# Patient Record
Sex: Male | Born: 1995 | Race: White | Hispanic: No | Marital: Single | Smoking: Current every day smoker
Health system: Southern US, Community
[De-identification: ages and names within clinical notes are randomized; demographics above are authoritative.]

## PROBLEM LIST (undated history)

## (undated) DIAGNOSIS — F32A Depression, unspecified: Secondary | ICD-10-CM

## (undated) DIAGNOSIS — F913 Oppositional defiant disorder: Secondary | ICD-10-CM

## (undated) DIAGNOSIS — F1210 Cannabis abuse, uncomplicated: Secondary | ICD-10-CM

## (undated) DIAGNOSIS — F909 Attention-deficit hyperactivity disorder, unspecified type: Secondary | ICD-10-CM

## (undated) DIAGNOSIS — F329 Major depressive disorder, single episode, unspecified: Secondary | ICD-10-CM

## (undated) HISTORY — DX: Attention-deficit hyperactivity disorder, unspecified type: F90.9

## (undated) HISTORY — DX: Depression, unspecified: F32.A

## (undated) HISTORY — PX: OTHER SURGICAL HISTORY: SHX169

## (undated) HISTORY — DX: Major depressive disorder, single episode, unspecified: F32.9

---
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
## 2016-04-27 ENCOUNTER — Ambulatory Visit: Payer: Self-pay | Admitting: Nurse Practitioner

## 2019-02-04 ENCOUNTER — Encounter: Payer: Self-pay | Admitting: Family Medicine

## 2019-02-05 ENCOUNTER — Encounter: Payer: Self-pay | Admitting: Family Medicine

## 2019-06-11 ENCOUNTER — Telehealth: Payer: Self-pay | Admitting: Orthopaedic Surgery

## 2019-06-11 NOTE — Telephone Encounter (Signed)
Made in error

## 2020-07-07 ENCOUNTER — Emergency Department (HOSPITAL_COMMUNITY): Payer: Managed Care, Other (non HMO)

## 2020-07-07 ENCOUNTER — Other Ambulatory Visit: Payer: Self-pay

## 2020-07-07 ENCOUNTER — Encounter (HOSPITAL_COMMUNITY): Payer: Self-pay | Admitting: *Deleted

## 2020-07-07 ENCOUNTER — Emergency Department (HOSPITAL_COMMUNITY)
Admission: EM | Admit: 2020-07-07 | Discharge: 2020-07-07 | Disposition: A | Discharge status: Home / Self Care | Payer: Managed Care, Other (non HMO) | Attending: Emergency Medicine | Admitting: Emergency Medicine

## 2020-07-07 DIAGNOSIS — F17200 Nicotine dependence, unspecified, uncomplicated: Secondary | ICD-10-CM | POA: Insufficient documentation

## 2020-07-07 DIAGNOSIS — R0602 Shortness of breath: Secondary | ICD-10-CM | POA: Diagnosis not present

## 2020-07-07 DIAGNOSIS — R079 Chest pain, unspecified: Secondary | ICD-10-CM | POA: Insufficient documentation

## 2020-07-07 DIAGNOSIS — E876 Hypokalemia: Secondary | ICD-10-CM | POA: Diagnosis not present

## 2020-07-07 DIAGNOSIS — R42 Dizziness and giddiness: Secondary | ICD-10-CM | POA: Insufficient documentation

## 2020-07-07 DIAGNOSIS — E860 Dehydration: Secondary | ICD-10-CM

## 2020-07-07 LAB — URINALYSIS, ROUTINE W REFLEX MICROSCOPIC
Bilirubin Urine: NEGATIVE
Glucose, UA: NEGATIVE mg/dL
Hgb urine dipstick: NEGATIVE
Ketones, ur: NEGATIVE mg/dL
Leukocytes,Ua: NEGATIVE
Nitrite: NEGATIVE
Protein, ur: NEGATIVE mg/dL
Specific Gravity, Urine: 1.004 — ABNORMAL LOW (ref 1.005–1.030)
pH: 6 (ref 5.0–8.0)

## 2020-07-07 LAB — CBC WITH DIFFERENTIAL/PLATELET
Abs Immature Granulocytes: 0.01 10*3/uL (ref 0.00–0.07)
Basophils Absolute: 0 10*3/uL (ref 0.0–0.1)
Basophils Relative: 1 %
Eosinophils Absolute: 0.1 10*3/uL (ref 0.0–0.5)
Eosinophils Relative: 2 %
HCT: 47.2 % (ref 39.0–52.0)
Hemoglobin: 16.1 g/dL (ref 13.0–17.0)
Immature Granulocytes: 0 %
Lymphocytes Relative: 36 %
Lymphs Abs: 1.7 10*3/uL (ref 0.7–4.0)
MCH: 32.7 pg (ref 26.0–34.0)
MCHC: 34.1 g/dL (ref 30.0–36.0)
MCV: 95.7 fL (ref 80.0–100.0)
Monocytes Absolute: 0.7 10*3/uL (ref 0.1–1.0)
Monocytes Relative: 14 %
Neutro Abs: 2.2 10*3/uL (ref 1.7–7.7)
Neutrophils Relative %: 47 %
Platelets: 259 10*3/uL (ref 150–400)
RBC: 4.93 MIL/uL (ref 4.22–5.81)
RDW: 12.8 % (ref 11.5–15.5)
WBC: 4.7 10*3/uL (ref 4.0–10.5)
nRBC: 0 % (ref 0.0–0.2)

## 2020-07-07 LAB — BASIC METABOLIC PANEL
Anion gap: 11 (ref 5–15)
BUN: 12 mg/dL (ref 6–20)
CO2: 22 mmol/L (ref 22–32)
Calcium: 9.8 mg/dL (ref 8.9–10.3)
Chloride: 103 mmol/L (ref 98–111)
Creatinine, Ser: 1.24 mg/dL (ref 0.61–1.24)
GFR, Estimated: >60 mL/min (ref >60)
Glucose, Bld: 66 mg/dL — ABNORMAL LOW (ref 70–99)
Potassium: 3.2 mmol/L — ABNORMAL LOW (ref 3.5–5.1)
Sodium: 136 mmol/L (ref 135–145)

## 2020-07-07 LAB — RAPID URINE DRUG SCREEN, HOSP PERFORMED
Amphetamines: NOT DETECTED
Barbiturates: NOT DETECTED
Benzodiazepines: NOT DETECTED
Cocaine: POSITIVE — AB
Opiates: NOT DETECTED
Tetrahydrocannabinol: POSITIVE — AB

## 2020-07-07 LAB — TROPONIN I (HIGH SENSITIVITY)
Troponin I (High Sensitivity): 5 ng/L (ref <18)
Troponin I (High Sensitivity): 4 ng/L (ref <18)

## 2020-07-07 MED ORDER — LORAZEPAM 1 MG PO TABS
1.0000 mg | ORAL_TABLET | Freq: Once | ORAL | Status: AC
  Administered 2020-07-07: 1 mg via ORAL
  Filled 2020-07-07: qty 1

## 2020-07-07 MED ORDER — POTASSIUM CHLORIDE 20 MEQ PO PACK
40.0000 meq | PACK | Freq: Once | ORAL | Status: AC
  Administered 2020-07-07: 40 meq via ORAL
  Filled 2020-07-07: qty 2

## 2020-07-07 MED ORDER — SODIUM CHLORIDE 0.9 % IV BOLUS
1000.0000 mL | Freq: Once | INTRAVENOUS | Status: AC
  Administered 2020-07-07: 1000 mL via INTRAVENOUS

## 2020-07-07 MED ORDER — POTASSIUM CHLORIDE CRYS ER 20 MEQ PO TBCR
40.0000 meq | EXTENDED_RELEASE_TABLET | Freq: Once | ORAL | Status: AC
Start: 2020-07-07 — End: 2020-07-07
  Administered 2020-07-07: 40 meq via ORAL
  Filled 2020-07-07: qty 2

## 2020-07-07 NOTE — ED Provider Notes (Signed)
[facility] EMERGENCY DEPARTMENT Provider Note   CSN: [id number] Arrival date & time: 07/07/20  1230     History Chief Complaint  Patient presents with   Chest Pain    Richard Cooke is a 25 y.o. male with PMHx ADHD, Depression, marijuana abuse, and polysubstance abuse who presents to the ED today with complaint of sudden onset, constant, shortness of breath that began about 45 minutes PTA. Pt also complains of diffuse chest tightness. He states he was at work when the symptoms started. He states there is some air conditioning but not much. He was wearing a T shirt, sweat pants, and boots. He feels lightheaded and nauseated as well. Pt states he has never had symptoms like this in the past. He denies nausea or vomiting. Pt is a current everyday smoker. He denies Fhx of CAD.   The history is provided by the patient and medical records.      Past Medical History:  Diagnosis Date   ADHD (attention deficit hyperactivity disorder)    Depression    Marijuana abuse    ODD (oppositional defiant disorder)     Patient Active Problem List   Diagnosis Date Noted   Polysubstance abuse (HCC) 12/14/2014   Generalized anxiety disorder 11/23/2013   Insomnia due to stress 11/23/2013   History of ADHD 11/23/2013   ADHD (attention deficit hyperactivity disorder), combined type 11/23/2013   Excessive anger 03/19/2012    Past Surgical History:  Procedure Laterality Date   Tubes in ears         Family History  Problem Relation Age of Onset   Depression Paternal Aunt    Anxiety disorder Paternal Aunt    Depression Paternal Grandfather    ADD / ADHD Sister    Drug abuse Maternal Aunt    Bipolar disorder Maternal Aunt    Alcohol abuse Maternal Grandfather    Dementia Maternal Grandfather    Dementia Maternal Grandmother        MGGM   Seizures Cousin    OCD Neg Hx    Paranoid behavior Neg Hx    Schizophrenia Neg Hx    Sexual abuse Neg Hx    Physical abuse Neg Hx     Social  History   Tobacco Use   Smoking status: Every Day    Pack years: 0.00   Smokeless tobacco: Former    Types: Snuff  Substance Use Topics   Alcohol use: Yes   Drug use: Yes    Home Medications Prior to Admission medications   Medication Sig Start Date End Date Taking? Authorizing Provider  albuterol (PROVENTIL HFA;VENTOLIN HFA) 108 (90 BASE) MCG/ACT inhaler Inhale 2 puffs into the lungs every 6 (six) hours as needed for wheezing. 12/17/14  Yes Armandina Stammer I, NP  hydrOXYzine (ATARAX/VISTARIL) 25 MG tablet Take 1 tablet (25 mg total) by mouth every 6 (six) hours as needed for anxiety or itching. Patient not taking: No sig reported 12/17/14   Sanjuana Kava, NP    Allergies    Daytrana [methylphenidate]  Review of Systems   Review of Systems  Constitutional:  Negative for chills and fever.  Respiratory:  Positive for shortness of breath. Negative for cough and wheezing.   Cardiovascular:  Positive for chest pain. Negative for palpitations and leg swelling.  Gastrointestinal:  Negative for nausea and vomiting.  Neurological:  Positive for light-headedness.  All other systems reviewed and are negative.  Physical Exam Updated Vital Signs BP 123/87 (BP Location: Right Arm)  Pulse 90   Temp 98.2 F (36.8 C) (Oral)   Resp 20   Ht 6\' 1"  (1.854 m)   Wt 72.6 kg   SpO2 99%   BMI 21.11 kg/m   Physical Exam Vitals and nursing note reviewed.  Constitutional:      Appearance: He is diaphoretic. He is not ill-appearing.     Comments: Appears anxious  HENT:     Head: Normocephalic and atraumatic.  Eyes:     Conjunctiva/sclera: Conjunctivae normal.  Cardiovascular:     Rate and Rhythm: Normal rate and regular rhythm.     Pulses:          Radial pulses are 2+ on the right side and 2+ on the left side.       Dorsalis pedis pulses are 2+ on the right side and 2+ on the left side.  Pulmonary:     Effort: Tachypnea present.     Breath sounds: Normal breath sounds. No decreased  breath sounds, wheezing, rhonchi or rales.  Chest:     Chest wall: No tenderness.  Abdominal:     Palpations: Abdomen is soft.     Tenderness: There is no abdominal tenderness. There is no guarding or rebound.  Musculoskeletal:     Cervical back: Neck supple.  Skin:    General: Skin is warm.  Neurological:     Mental Status: He is alert.    ED Results / Procedures / Treatments   Labs (all labs ordered are listed, but only abnormal results are displayed) Labs Reviewed  BASIC METABOLIC PANEL - Abnormal; Notable for the following components:      Result Value   Potassium 3.2 (*)    Glucose, Bld 66 (*)    All other components within normal limits  URINALYSIS, ROUTINE W REFLEX MICROSCOPIC - Abnormal; Notable for the following components:   Specific Gravity, Urine 1.004 (*)    All other components within normal limits  RAPID URINE DRUG SCREEN, HOSP PERFORMED - Abnormal; Notable for the following components:   Cocaine POSITIVE (*)    Tetrahydrocannabinol POSITIVE (*)    All other components within normal limits  CBC WITH DIFFERENTIAL/PLATELET  TROPONIN I (HIGH SENSITIVITY)  TROPONIN I (HIGH SENSITIVITY)    EKG EKG Interpretation  Date/Time:  Thursday July 07 2020 14:00:35 EDT Ventricular Rate:  51 PR Interval:  137 QRS Duration: 104 QT Interval:  450 QTC Calculation: 415 R Axis:   89 Text Interpretation: Sinus rhythm ST elevation/early repolarization Confirmed by 01[birth year]26[birth year]1994 [phone]) on 07/07/2020 2:11:22 PM  Radiology DG Chest Port 1 View  Result Date: 07/07/2020 CLINICAL DATA:  Chest pain and shortness of breath EXAM: PORTABLE CHEST 1 VIEW COMPARISON:  12/13/2014 FINDINGS: The lungs appear clear. Cardiac and mediastinal contours normal. No pleural effusion identified. No specific bony abnormality is identified. IMPRESSION: No active disease. Electronically Signed   By: 14/05/2014 M.D.   On: 07/07/2020 13:25    Procedures Procedures   Medications Ordered in  ED Medications  sodium chloride 0.9 % bolus 1,000 mL (0 mLs Intravenous Stopped 07/07/20 1451)  LORazepam (ATIVAN) tablet 1 mg (1 mg Oral Given 07/07/20 1411)  potassium chloride SA (KLOR-CON) CR tablet 40 mEq (40 mEq Oral Given 07/07/20 1411)  potassium chloride (KLOR-CON) packet 40 mEq (40 mEq Oral Given 07/07/20 1451)    ED Course  I have reviewed the triage vital signs and the nursing notes.  Pertinent labs & imaging results that were available during my care of the  patient were reviewed by me and considered in my medical decision making (see chart for details).  Clinical Course as of 07/07/20 1711  Thu Jul 07, 2020  1351 Glucose(!): 66 [MV]  1625 COCAINE(!): POSITIVE [MV]    Clinical Course User Index [MV] Tanda RockersVenter, Lyriq Jarchow, PA-C   MDM Rules/Calculators/A&P                          25 year old male who presents to the ED today with complaint of sudden onset shortness of breath and diffuse chest tightness with lightheadedness that began about 45 minutes prior to arrival.  Was working when pain started, mild air conditioning however was wearing long sweatpants and boots.  On arrival to the ED today patient is afebrile, nontachycardic and nontachypneic and appears to be in no acute distress however when I am in the room he is diaphoretic and appears mildly tachypneic however also anxious.  He is speaking in full sentences without difficulty and he has equal and clear lung sounds bilaterally.  Very low suspicion for spontaneous pneumo causing symptoms.  Patient is however a current every day smoker.  EKG obtained; does some mild ST Elevation in inferior leads likely early repol however do not have baseline to compare to. Will plan to discuss with cardiology to evaluate EKG as well.   We will plan for chest x-ray at this time to assess for any lung abnormalities causing pain.  Question if patient is having symptoms consistent with possible heat exposure given working with minimal air conditioning  and long pants today.  We will plan to check electrolytes.  Will provide fluids.  Will provide oral Ativan for anxiety.  Given diaphoresis and symptoms we will plan for troponin however very low suspicion for ACS at this time.  Patient does have a history of polysubstance abuse unfortunately and therefore will obtain a UDS.   CXR clear CBC without leukocytosis. Hgb stable at 16.1 BMP with potassium 3.2, will replete. Glucose 66. Pt provided orange juice. No hx of diabetes. Creatinine slightly elevated at 1.24, GFR > 60.   On reevaluation after fluids, ativan, and potassium pt reports he feels significantly improved. No longer have chest pain or SOB.   Discussed case with cardiologist Dr. Deforest HoylesAkhter who evaluated EKG; likely early repol vs potential for pericarditis. If chest pain continues and is severe pt can be admitted for observation otherwise agrees with IV hydration and discharge. Pt denies any viral illness recently.   Repeat troponin of 4 U/A without infection or hgb.  UDS positive for cocaine and THC.   Discussed with pt that cocaine use likely caused vasospasm today on top of some dehydration. He last used 2 days ago. Reports he plans to stay away from cocaine from now on. Will discharge home at this time. Pt instructed to have potassium level rechecked in 1-2 weeks. He is in agreement with plan and stable for discharge.   This note was prepared using Dragon voice recognition software and may include unintentional dictation errors due to the inherent limitations of voice recognition software.  Final Clinical Impression(s) / ED Diagnoses Final diagnoses:  Nonspecific chest pain  Hypokalemia  Dehydration    Rx / DC Orders ED Discharge Orders     None        Discharge Instructions      Your workup was overall reassuring in the ED. Your potassium was slightly decreased however we had repleted it in the ED. Please have  your potassium level rechecked in 1-2 weeks. You should  increase the amount of potassium in your diet (bananas, leafy green vegetables, potatoes). I would also recommend increasing the amount of water you drink as you were dehydrated today which could have contributed to your symptoms.   Follow up with your PCP regarding ED visit today  Return to the ED for any new/worsening symptoms       Tanda Rockers, PA-C 07/07/20 1711    Long, Arlyss Repress, MD 07/08/20 [phone]

## 2020-07-07 NOTE — Discharge Instructions (Addendum)
Your workup was overall reassuring in the ED. Your potassium was slightly decreased however we had repleted it in the ED. Please have your potassium level rechecked in 1-2 weeks. You should increase the amount of potassium in your diet (bananas, leafy green vegetables, potatoes). I would also recommend increasing the amount of water you drink as you were dehydrated today which could have contributed to your symptoms.   Follow up with your PCP regarding ED visit today  Return to the ED for any new/worsening symptoms

## 2020-07-07 NOTE — ED Triage Notes (Signed)
Chest pain with shortness of breath x 30 minutes

## 2021-09-19 ENCOUNTER — Encounter: Payer: Self-pay | Admitting: Emergency Medicine

## 2021-09-19 ENCOUNTER — Ambulatory Visit
Admission: EM | Admit: 2021-09-19 | Discharge: 2021-09-19 | Disposition: A | Discharge status: Home / Self Care | Payer: Managed Care, Other (non HMO)

## 2021-09-19 ENCOUNTER — Ambulatory Visit: Payer: Self-pay

## 2021-09-19 DIAGNOSIS — Z87898 Personal history of other specified conditions: Secondary | ICD-10-CM

## 2021-09-19 DIAGNOSIS — R112 Nausea with vomiting, unspecified: Secondary | ICD-10-CM

## 2021-09-19 DIAGNOSIS — Z0289 Encounter for other administrative examinations: Secondary | ICD-10-CM

## 2021-09-19 NOTE — ED Provider Notes (Signed)
[facility]    CSN: [id number] Arrival date & time: 09/19/21  0857      History   Chief Complaint No chief complaint on file.   HPI Richard Cooke is a 26 y.o. male.   Patient presents for headache and nausea with vomiting that occurred over the weekend on Saturday and Sunday.  Reports starting Monday morning, he felt back to normal.  Reports he needs a note that he can return to work today.  Currently, he denies any decreased appetite, abdominal pain, diarrhea, blood in his stool, headache, vision changes, chest pain, shortness of breath, lightheadedness/dizziness.  He has not had any nausea or vomiting or headache since Sunday.  He feels in his normal state of health.    Past Medical History:  Diagnosis Date   ADHD (attention deficit hyperactivity disorder)    Depression    Marijuana abuse    ODD (oppositional defiant disorder)     Patient Active Problem List   Diagnosis Date Noted   Polysubstance abuse (HCC) 12/14/2014   Generalized anxiety disorder 11/23/2013   Insomnia due to stress 11/23/2013   History of ADHD 11/23/2013   ADHD (attention deficit hyperactivity disorder), combined type 11/23/2013   Excessive anger 03/19/2012    Past Surgical History:  Procedure Laterality Date   Tubes in ears         Home Medications    Prior to Admission medications   Medication Sig Start Date End Date Taking? Authorizing Provider  albuterol (PROVENTIL HFA;VENTOLIN HFA) 108 (90 BASE) MCG/ACT inhaler Inhale 2 puffs into the lungs every 6 (six) hours as needed for wheezing. 12/17/14   Armandina Stammer I, NP  hydrOXYzine (ATARAX/VISTARIL) 25 MG tablet Take 1 tablet (25 mg total) by mouth every 6 (six) hours as needed for anxiety or itching. Patient not taking: No sig reported 12/17/14   Armandina Stammer I, NP    Family History Family History  Problem Relation Age of Onset   Depression Paternal Aunt    Anxiety disorder Paternal Aunt    Depression Paternal Grandfather     ADD / ADHD Sister    Drug abuse Maternal Aunt    Bipolar disorder Maternal Aunt    Alcohol abuse Maternal Grandfather    Dementia Maternal Grandfather    Dementia Maternal Grandmother        MGGM   Seizures Cousin    OCD Neg Hx    Paranoid behavior Neg Hx    Schizophrenia Neg Hx    Sexual abuse Neg Hx    Physical abuse Neg Hx     Social History Social History   Tobacco Use   Smoking status: Every Day   Smokeless tobacco: Former    Types: Snuff  Substance Use Topics   Alcohol use: Yes   Drug use: Yes     Allergies   Daytrana [methylphenidate]   Review of Systems Review of Systems Per HPI  Physical Exam Triage Vital Signs ED Triage Vitals [09/19/21 0905]  Enc Vitals Group     BP (!) 153/80     Pulse Rate 69     Resp 16     Temp 98.1 F (36.7 C)     Temp Source Oral     SpO2 98 %     Weight      Height      Head Circumference      Peak Flow      Pain Score 0     Pain Loc  Pain Edu?      Excl. in GC?    No data found.  Updated Vital Signs BP (!) 153/80 (BP Location: Right Arm)   Pulse 69   Temp 98.1 F (36.7 C) (Oral)   Resp 16   SpO2 98%   Blood pressure recheck: 145/82  Visual Acuity Right Eye Distance:   Left Eye Distance:   Bilateral Distance:    Right Eye Near:   Left Eye Near:    Bilateral Near:     Physical Exam Vitals and nursing note reviewed.  Constitutional:      General: He is not in acute distress.    Appearance: Normal appearance. He is obese.  Eyes:     General: No scleral icterus.    Extraocular Movements: Extraocular movements intact.  Cardiovascular:     Rate and Rhythm: Normal rate.     Heart sounds: No murmur heard. Pulmonary:     Effort: Pulmonary effort is normal. No respiratory distress.     Breath sounds: Normal breath sounds. No stridor. No wheezing, rhonchi or rales.  Abdominal:     General: Abdomen is flat. Bowel sounds are normal. There is no distension.     Palpations: There is no mass.      Tenderness: There is no abdominal tenderness. There is no right CVA tenderness, left CVA tenderness, guarding or rebound.  Skin:    General: Skin is warm and dry.     Capillary Refill: Capillary refill takes less than 2 seconds.     Coloration: Skin is not jaundiced or pale.  Neurological:     Mental Status: He is alert and oriented to person, place, and time.  Psychiatric:        Behavior: Behavior is cooperative.      UC Treatments / Results  Labs (all labs ordered are listed, but only abnormal results are displayed) Labs Reviewed - No data to display  EKG   Radiology No results found.  Procedures Procedures (including critical care time)  Medications Ordered in UC Medications - No data to display  Initial Impression / Assessment and Plan / UC Course  I have reviewed the triage vital signs and the nursing notes.  Pertinent labs & imaging results that were available during my care of the patient were reviewed by me and considered in my medical decision making (see chart for details).   Patient is well-appearing, normotensive upon blood pressure recheck, not tachycardic, not tachypneic, afebrile, and oxygenating well on room air.  Suspect viral gastroenteritis over the weekend, that is now resolved.  Will give note that he can return to work as he is feeling in his normal state of health and his vital signs are stable.  Return and ER precautions discussed. Final Clinical Impressions(s) / UC Diagnoses   Final diagnoses:  Nausea and vomiting, unspecified vomiting type  History of headache  Encounter to obtain excuse from work   Discharge Instructions   None    ED Prescriptions   None    PDMP not reviewed this encounter.   Valentino Nose, NP 09/19/21 [phone]

## 2021-09-19 NOTE — ED Triage Notes (Signed)
Vomiting and headache on Saturday and Sunday.  Needs a work note.

## 2022-04-06 ENCOUNTER — Other Ambulatory Visit: Payer: Self-pay

## 2022-04-06 ENCOUNTER — Encounter (HOSPITAL_COMMUNITY): Payer: Self-pay

## 2022-04-06 ENCOUNTER — Emergency Department (HOSPITAL_COMMUNITY)
Admission: EM | Admit: 2022-04-06 | Discharge: 2022-04-06 | Disposition: A | Discharge status: Home / Self Care | Payer: Self-pay | Attending: Emergency Medicine | Admitting: Emergency Medicine

## 2022-04-06 ENCOUNTER — Emergency Department (HOSPITAL_COMMUNITY): Payer: Self-pay

## 2022-04-06 DIAGNOSIS — R519 Headache, unspecified: Secondary | ICD-10-CM | POA: Insufficient documentation

## 2022-04-06 DIAGNOSIS — R52 Pain, unspecified: Secondary | ICD-10-CM

## 2022-04-06 DIAGNOSIS — Z20822 Contact with and (suspected) exposure to covid-19: Secondary | ICD-10-CM | POA: Insufficient documentation

## 2022-04-06 DIAGNOSIS — M791 Myalgia, unspecified site: Secondary | ICD-10-CM | POA: Insufficient documentation

## 2022-04-06 DIAGNOSIS — R0981 Nasal congestion: Secondary | ICD-10-CM | POA: Insufficient documentation

## 2022-04-06 DIAGNOSIS — R059 Cough, unspecified: Secondary | ICD-10-CM | POA: Insufficient documentation

## 2022-04-06 LAB — RESP PANEL BY RT-PCR (RSV, FLU A&B, COVID)  RVPGX2
Influenza A by PCR: NEGATIVE
Influenza B by PCR: NEGATIVE
Resp Syncytial Virus by PCR: NEGATIVE
SARS Coronavirus 2 by RT PCR: NEGATIVE

## 2022-04-06 MED ORDER — IBUPROFEN 400 MG PO TABS
600.0000 mg | ORAL_TABLET | Freq: Once | ORAL | Status: AC
  Administered 2022-04-06: 600 mg via ORAL
  Filled 2022-04-06: qty 2

## 2022-04-06 NOTE — ED Triage Notes (Signed)
Pt endorses headache, cough, congestion, chills x 2 days. Denies any sick contacts.   Last dose of nyquil last night.

## 2022-04-06 NOTE — Discharge Instructions (Addendum)
Evaluation for your cough and congestion and bodyaches is likely upper respiratory infection of some kind. Respiratory panel evaluate for COVID, flu and RSV is pending. You can follow-up on those results on your MyChart. I would recommend conservative treatment for all 3 which includes primarily rest and hydration.  Start to have shortness of breath, inability to tolerate fluid intake.  Extreme fatigue, chest pain or any other concerning symptom please return emergency department further evaluation.

## 2022-04-06 NOTE — ED Provider Notes (Signed)
[facility] Provider Note   CSN: [id number] Arrival date & time: 04/06/22  1033     History  Chief Complaint  Patient presents with   Headache   Cough   HPI Richard Cooke is a 27 y.o. male presenting for cough, body aches and congestion.  Symptoms started about 2 days ago.  Denies sick contacts.  Denies shortness of breath and chest pain.  States that the cough has been productive.  States that the headache has been mild.  Denies visual disturbance or nuchal rigidity.  States he is also having generalized bodyaches.  Patient is a smoker.  States he is trying to quit cigarettes but is still vaping several hours during the day.   Headache Associated symptoms: cough   Cough Associated symptoms: headaches        Home Medications Prior to Admission medications   Medication Sig Start Date End Date Taking? Authorizing Provider  albuterol (PROVENTIL HFA;VENTOLIN HFA) 108 (90 BASE) MCG/ACT inhaler Inhale 2 puffs into the lungs every 6 (six) hours as needed for wheezing. 12/17/14   Lindell Spar I, NP  hydrOXYzine (ATARAX/VISTARIL) 25 MG tablet Take 1 tablet (25 mg total) by mouth every 6 (six) hours as needed for anxiety or itching. Patient not taking: No sig reported 12/17/14   Encarnacion Slates, NP      Allergies    Daytrana [methylphenidate]    Review of Systems   Review of Systems  Respiratory:  Positive for cough.   Neurological:  Positive for headaches.    Physical Exam   Vitals:   04/06/22 1106  BP: (!) 146/78  Pulse: 63  Resp: 16  Temp: 97.8 F (36.6 C)  SpO2: 98%    CONSTITUTIONAL:  well-appearing, NAD NEURO:  GCS 15. Speech is goal oriented. No deficits appreciated to CN III-XII; symmetric eyebrow raise, no facial drooping, tongue midline. Patient has equal grip strength bilaterally with 5/5 strength against resistance in all major muscle groups bilaterally. Sensation to light touch intact. Patient moves extremities  without ataxia. Normal finger-nose-finger. Patient ambulatory with steady gait. EYES:  eyes equal and reactive ENT/NECK:  Supple, no stridor  CARDIO:  regular rate and rhythm, appears well-perfused  PULM:  No respiratory distress, CTAB GI/GU:  non-distended, soft MSK/SPINE:  No gross deformities, no edema, moves all extremities  SKIN:  no rash, atraumatic  *Additional and/or pertinent findings included in MDM below    ED Results / Procedures / Treatments   Labs (all labs ordered are listed, but only abnormal results are displayed) Labs Reviewed  RESP PANEL BY RT-PCR (RSV, FLU A&B, COVID)  [id number]    EKG None  Radiology DG Chest Port 1 View  Result Date: 04/06/2022 CLINICAL DATA:  Headache, cough, congestion, chills. EXAM: PORTABLE CHEST 1 VIEW COMPARISON:  Chest radiograph 07/07/2020 FINDINGS: The cardiomediastinal silhouette is normal. There is no focal consolidation or pulmonary edema. There is no pleural effusion or pneumothorax There is no acute osseous abnormality. IMPRESSION: No radiographic evidence of acute cardiopulmonary process. Electronically Signed   By: Valetta Mole M.D.   On: 04/06/2022 11:50    Procedures Procedures    Medications Ordered in ED Medications  ibuprofen (ADVIL) tablet 600 mg (has no administration in time range)    ED Course/ Medical Decision Making/ A&P                             Medical Decision  Making Amount and/or Complexity of Data Reviewed Radiology: ordered.   27 year old male who is well-appearing presenting for cough and body ache.  Exam was unremarkable.  Overall patient looks very clinically well.  Respiratory PCR is still pending.  Patient did express desire to be discharged and treat symptoms conservatively at home.  Thought that was appropriate given his presentation today.  Advised that he could follow-up on his results on MyChart.  Discussed pertinent return precautions.  Treated with ibuprofen today.  Advised to follow-up  with his PCP if his symptoms persist.        Final Clinical Impression(s) / ED Diagnoses Final diagnoses:  Cough, unspecified type  Body aches    Rx / DC Orders ED Discharge Orders     None         Harriet Pho, PA-C 04/06/22 1404    Fredia Sorrow, MD 04/09/22 [phone]

## 2023-01-10 IMAGING — DX DG CHEST 1V PORT
2 series · 2 of 2 positions shown · non-contrast
Comparison: 12/13/2014

CLINICAL DATA: Chest pain and shortness of breath

EXAM:
PORTABLE CHEST 1 VIEW

[chest ap (1 of 2)]
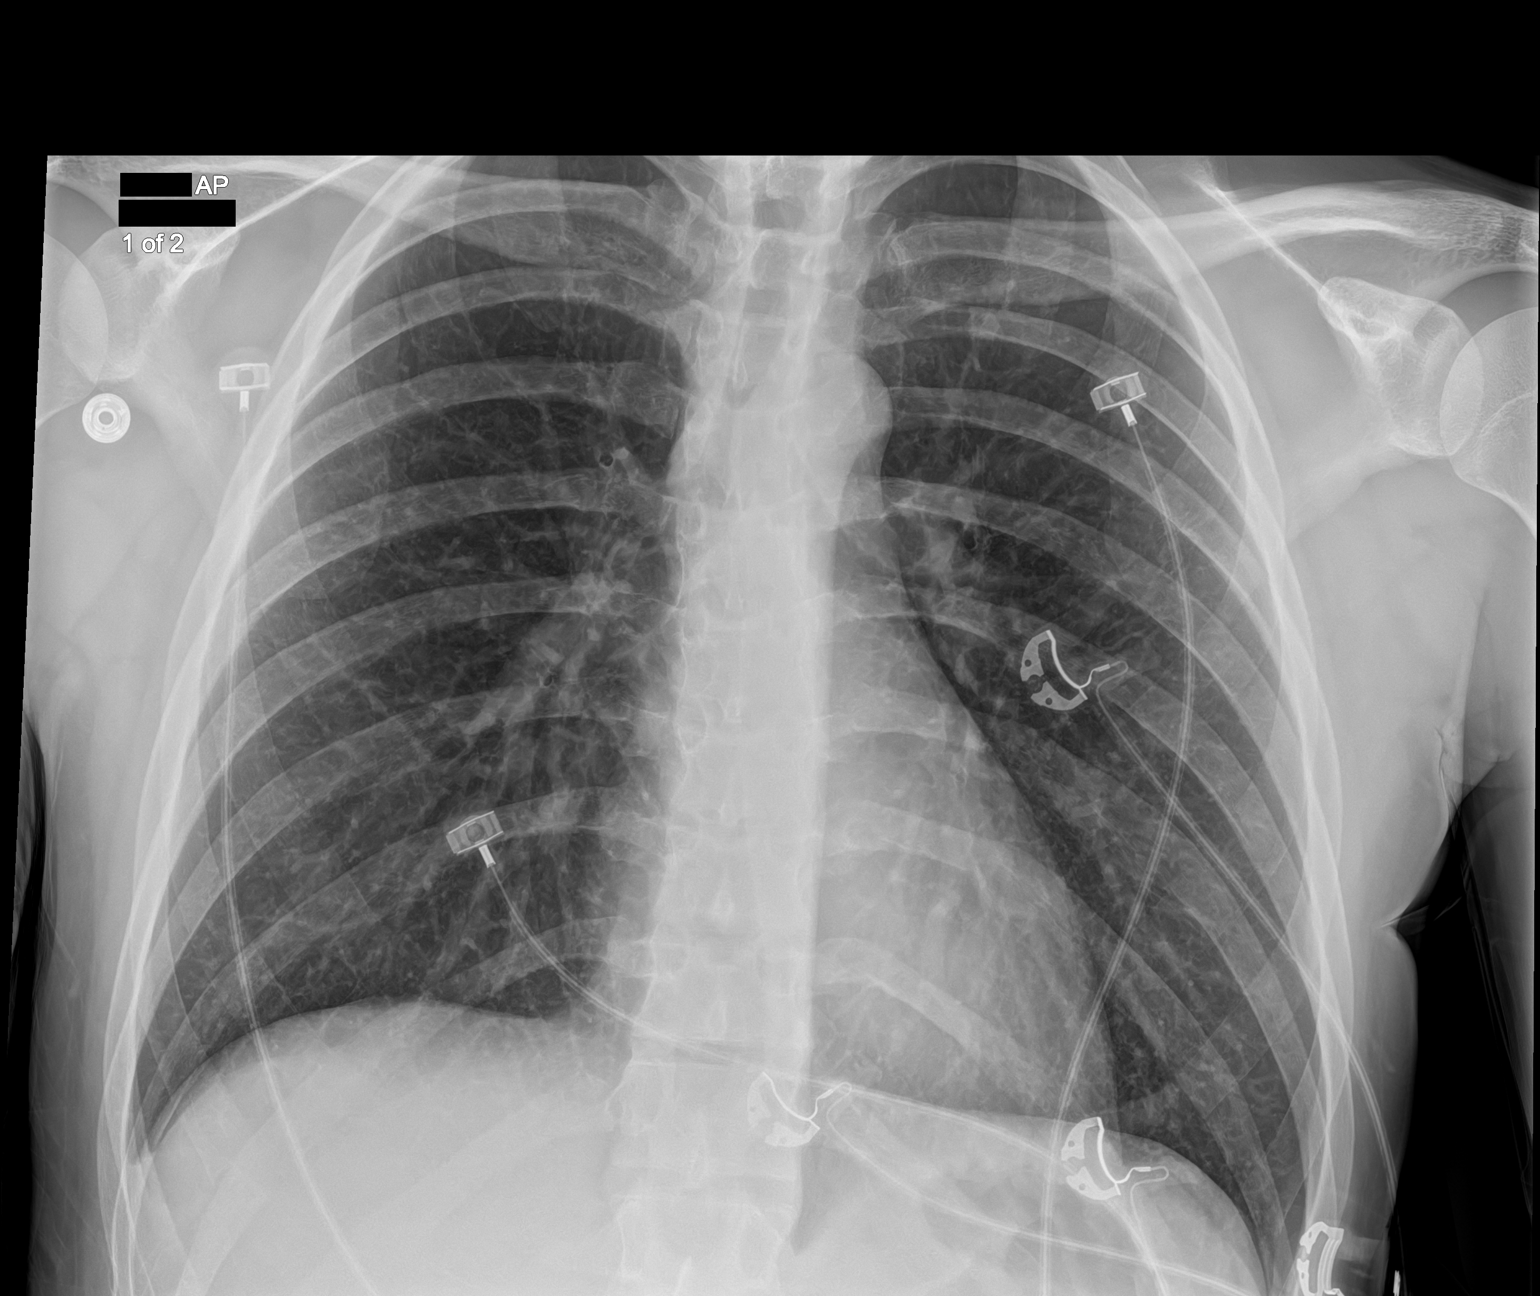

[chest ap (2 of 2)]
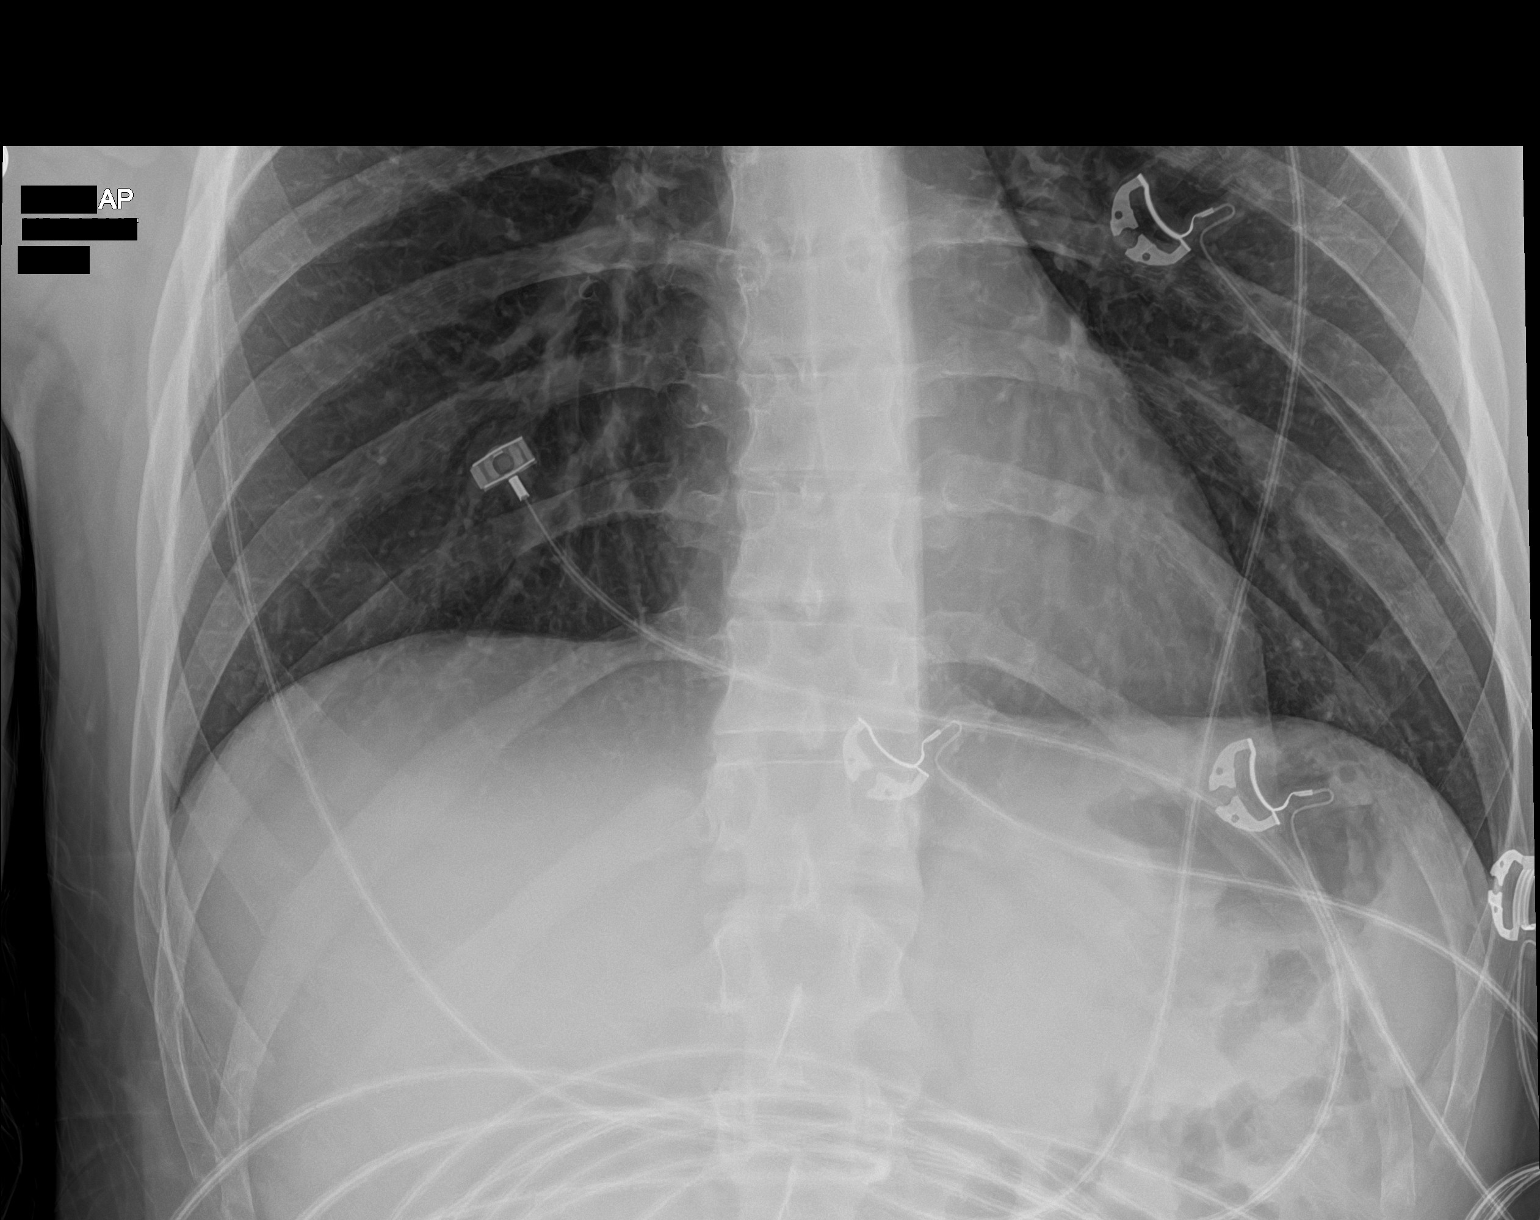

[2 of 2 positions shown; findings below may reference images not displayed]

FINDINGS: The lungs appear clear. Cardiac and mediastinal contours normal. No
pleural effusion identified. No specific bony abnormality is
identified.
IMPRESSION: No active disease.
